# Patient Record
Sex: Female | Born: 1950 | Race: White | Hispanic: No | Marital: Single | Smoking: Current every day smoker
Health system: Southern US, Community
[De-identification: ages and names within clinical notes are randomized; demographics above are authoritative.]

## PROBLEM LIST (undated history)

## (undated) DIAGNOSIS — J449 Chronic obstructive pulmonary disease, unspecified: Secondary | ICD-10-CM

## (undated) HISTORY — PX: FOOT SURGERY: SHX648

## (undated) HISTORY — DX: Chronic obstructive pulmonary disease, unspecified: J44.9

---

## 2011-07-29 ENCOUNTER — Emergency Department: Payer: Self-pay | Admitting: Emergency Medicine

## 2013-06-26 ENCOUNTER — Emergency Department: Payer: Self-pay | Admitting: Emergency Medicine

## 2013-06-26 LAB — URINALYSIS, COMPLETE
BACTERIA: NONE SEEN
BILIRUBIN, UR: NEGATIVE
Glucose,UR: NEGATIVE mg/dL (ref 0–75)
LEUKOCYTE ESTERASE: NEGATIVE
Nitrite: NEGATIVE
Ph: 5 (ref 4.5–8.0)
RBC,UR: 18 /HPF (ref 0–5)
Specific Gravity: 1.023 (ref 1.003–1.030)

## 2014-01-14 ENCOUNTER — Emergency Department: Payer: Self-pay | Admitting: Emergency Medicine

## 2014-04-01 ENCOUNTER — Ambulatory Visit: Payer: Self-pay | Admitting: Internal Medicine

## 2014-06-24 ENCOUNTER — Institutional Professional Consult (permissible substitution): Payer: Self-pay | Admitting: Specialist

## 2014-07-01 ENCOUNTER — Institutional Professional Consult (permissible substitution): Payer: Self-pay | Admitting: Specialist

## 2014-07-03 ENCOUNTER — Other Ambulatory Visit: Payer: Self-pay | Admitting: Specialist

## 2014-07-03 DIAGNOSIS — M79604 Pain in right leg: Secondary | ICD-10-CM

## 2014-07-08 ENCOUNTER — Ambulatory Visit
Admission: RE | Admit: 2014-07-08 | Discharge: 2014-07-08 | Disposition: A | Discharge status: Home / Self Care | Payer: PRIVATE HEALTH INSURANCE | Source: Ambulatory Visit | Attending: Specialist | Admitting: Specialist

## 2014-07-08 DIAGNOSIS — R937 Abnormal findings on diagnostic imaging of other parts of musculoskeletal system: Secondary | ICD-10-CM | POA: Diagnosis not present

## 2014-07-08 DIAGNOSIS — M79604 Pain in right leg: Secondary | ICD-10-CM | POA: Insufficient documentation

## 2014-07-22 ENCOUNTER — Ambulatory Visit: Payer: Self-pay | Admitting: Specialist

## 2014-07-29 ENCOUNTER — Other Ambulatory Visit: Payer: Self-pay | Admitting: Specialist

## 2014-07-29 ENCOUNTER — Ambulatory Visit: Payer: Self-pay | Admitting: Specialist

## 2014-07-29 DIAGNOSIS — M5431 Sciatica, right side: Secondary | ICD-10-CM

## 2014-08-04 ENCOUNTER — Ambulatory Visit
Admission: RE | Admit: 2014-08-04 | Discharge: 2014-08-04 | Disposition: A | Discharge status: Home / Self Care | Payer: PRIVATE HEALTH INSURANCE | Source: Ambulatory Visit | Attending: Specialist | Admitting: Specialist

## 2014-08-04 DIAGNOSIS — M5431 Sciatica, right side: Secondary | ICD-10-CM

## 2014-08-05 ENCOUNTER — Encounter: Payer: Self-pay | Admitting: *Deleted

## 2014-08-05 ENCOUNTER — Ambulatory Visit: Payer: PRIVATE HEALTH INSURANCE | Attending: Oncology | Admitting: *Deleted

## 2014-08-05 ENCOUNTER — Ambulatory Visit
Admission: RE | Admit: 2014-08-05 | Discharge: 2014-08-05 | Disposition: A | Discharge status: Home / Self Care | Payer: Self-pay | Source: Ambulatory Visit | Attending: Oncology | Admitting: Oncology

## 2014-08-05 VITALS — BP 124/84 | HR 65 | Temp 98.1°F | Resp 20 | Ht 64.96 in | Wt 259.7 lb

## 2014-08-05 DIAGNOSIS — Z0000 Encounter for general adult medical examination without abnormal findings: Secondary | ICD-10-CM

## 2014-08-05 NOTE — Progress Notes (Signed)
Subjective:     Patient ID: Jaime Ramsey, female   DOB: 1950-12-28, 64 y.o.   MRN: [id number]  HPI   Review of Systems     Objective:   Physical Exam  Pulmonary/Chest: Right breast exhibits no inverted nipple, no mass, no nipple discharge, no skin change and no tenderness. Left breast exhibits no inverted nipple, no mass, no nipple discharge, no skin change and no tenderness. Breasts are symmetrical.  Abdominal: There is no splenomegaly.  Genitourinary: Rectal exam shows external hemorrhoid. Rectal exam shows no mass. There is no rash, tenderness or lesion on the right labia. There is no rash, tenderness, lesion or injury on the left labia. Cervix exhibits no motion tenderness, no discharge and no friability. Right adnexum displays no mass, no tenderness and no fullness. Left adnexum displays no mass, no tenderness and no fullness. No tenderness or bleeding in the vagina. No foreign body around the vagina. No vaginal discharge found.       Assessment:     64 year old White female presents to [facility] for clinical breast exam, pap smear and mammogram.  Clinical breast exam unremarkable.  Specimen collected for pap smear with some difficulty due to a large rectocele. Taught self breast awareness. Patient has been screened for eligibility.  She does not have any insurance, Medicare or Medicaid.  She also meets financial eligibility.  Hand-out given on the Affordable Care Act.    Plan:     Screening mammogram ordered.  Follow up per [facility] protocol.

## 2014-08-07 ENCOUNTER — Ambulatory Visit
Admission: RE | Admit: 2014-08-07 | Discharge: 2014-08-07 | Disposition: A | Discharge status: Home / Self Care | Payer: PRIVATE HEALTH INSURANCE | Source: Ambulatory Visit | Attending: Specialist | Admitting: Specialist

## 2014-08-07 DIAGNOSIS — M5441 Lumbago with sciatica, right side: Secondary | ICD-10-CM | POA: Insufficient documentation

## 2014-08-10 LAB — PAP LB AND HPV HIGH-RISK
HPV, HIGH-RISK: NEGATIVE
PAP Smear Comment: 0

## 2014-08-11 ENCOUNTER — Telehealth: Payer: Self-pay | Admitting: *Deleted

## 2014-08-11 NOTE — Telephone Encounter (Signed)
Called and informed patient of her normal mammogram results and her pap smear results of HPV negative ASCUS.  Per [facility] protocol patient will be due for next mammo in one year and next pap in 3 years.  Patient voices understanding.

## 2014-08-12 ENCOUNTER — Ambulatory Visit: Payer: Self-pay | Admitting: Ophthalmology

## 2014-08-19 ENCOUNTER — Ambulatory Visit: Payer: Self-pay | Admitting: Specialist

## 2014-09-16 ENCOUNTER — Ambulatory Visit: Payer: Self-pay | Admitting: Internal Medicine

## 2014-10-14 ENCOUNTER — Ambulatory Visit: Payer: Self-pay | Admitting: Internal Medicine

## 2014-11-18 ENCOUNTER — Other Ambulatory Visit: Payer: Self-pay

## 2014-11-18 LAB — TSH: TSH: 1.13 u[IU]/mL (ref <=5.90)

## 2014-11-25 ENCOUNTER — Ambulatory Visit: Payer: Self-pay | Admitting: Internal Medicine

## 2015-03-24 ENCOUNTER — Other Ambulatory Visit: Payer: Self-pay

## 2015-03-31 ENCOUNTER — Ambulatory Visit: Payer: Self-pay | Admitting: Internal Medicine

## 2015-03-31 ENCOUNTER — Other Ambulatory Visit: Payer: Self-pay

## 2015-03-31 LAB — CBC AND DIFFERENTIAL
HEMATOCRIT: 45 % (ref 36–46)
Hemoglobin: 15.5 g/dL (ref 12.0–16.0)
NEUTROS ABS: 4 /uL
PLATELETS: 291 10*3/uL (ref 150–399)
WBC: 6.9 10*3/mL

## 2015-03-31 LAB — BASIC METABOLIC PANEL
BUN: 11 mg/dL (ref 4–21)
Creatinine: 0.9 mg/dL (ref 0.5–1.1)
GLUCOSE: 96 mg/dL
Potassium: 4.5 mmol/L (ref 3.4–5.3)
SODIUM: 142 mmol/L (ref 137–147)

## 2015-03-31 LAB — HEPATIC FUNCTION PANEL
ALT: 15 U/L (ref 7–35)
AST: 12 U/L — AB (ref 13–35)
Alkaline Phosphatase: 79 U/L (ref 25–125)
BILIRUBIN, TOTAL: 0.3 mg/dL

## 2015-03-31 LAB — MICROALBUMIN, URINE: Microalb, Ur: 4.1

## 2015-03-31 LAB — LIPID PANEL
Cholesterol: 204 mg/dL — AB (ref 0–200)
HDL: 47 mg/dL (ref 35–70)
LDL Cholesterol: 125 mg/dL
Triglycerides: 162 mg/dL — AB (ref 40–160)

## 2015-03-31 LAB — HEMOGLOBIN A1C: Hemoglobin A1C: 5.8

## 2015-04-07 ENCOUNTER — Ambulatory Visit: Payer: Self-pay | Admitting: Internal Medicine

## 2015-04-07 DIAGNOSIS — L309 Dermatitis, unspecified: Secondary | ICD-10-CM | POA: Insufficient documentation

## 2015-04-07 DIAGNOSIS — R030 Elevated blood-pressure reading, without diagnosis of hypertension: Secondary | ICD-10-CM | POA: Insufficient documentation

## 2015-04-07 DIAGNOSIS — I10 Essential (primary) hypertension: Secondary | ICD-10-CM | POA: Insufficient documentation

## 2015-05-14 DIAGNOSIS — R030 Elevated blood-pressure reading, without diagnosis of hypertension: Secondary | ICD-10-CM

## 2015-05-14 DIAGNOSIS — L309 Dermatitis, unspecified: Secondary | ICD-10-CM

## 2015-05-14 DIAGNOSIS — I10 Essential (primary) hypertension: Secondary | ICD-10-CM

## 2015-07-07 ENCOUNTER — Ambulatory Visit: Payer: Self-pay | Admitting: Internal Medicine

## 2015-07-08 LAB — COMPREHENSIVE METABOLIC PANEL
ALBUMIN: 4.2 g/dL (ref 3.6–4.8)
ALK PHOS: 74 IU/L (ref 39–117)
ALT: 13 IU/L (ref 0–32)
AST: 12 IU/L (ref 0–40)
Albumin/Globulin Ratio: 1.6 (ref 1.2–2.2)
BILIRUBIN TOTAL: 0.4 mg/dL (ref 0.0–1.2)
BUN / CREAT RATIO: 11 — AB (ref 12–28)
BUN: 11 mg/dL (ref 8–27)
CHLORIDE: 96 mmol/L (ref 96–106)
CO2: 27 mmol/L (ref 18–29)
Calcium: 9.7 mg/dL (ref 8.7–10.3)
Creatinine, Ser: 0.99 mg/dL (ref 0.57–1.00)
GFR calc Af Amer: 70 mL/min/{1.73_m2} (ref >59)
GFR calc non Af Amer: 60 mL/min/{1.73_m2} (ref >59)
GLOBULIN, TOTAL: 2.7 g/dL (ref 1.5–4.5)
Glucose: 102 mg/dL — ABNORMAL HIGH (ref 65–99)
Potassium: 4.4 mmol/L (ref 3.5–5.2)
SODIUM: 140 mmol/L (ref 134–144)
Total Protein: 6.9 g/dL (ref 6.0–8.5)

## 2015-07-08 LAB — CBC WITH DIFFERENTIAL/PLATELET
BASOS ABS: 0 10*3/uL (ref 0.0–0.2)
Basos: 0 %
EOS (ABSOLUTE): 0.3 10*3/uL (ref 0.0–0.4)
EOS: 4 %
HEMATOCRIT: 46.2 % (ref 34.0–46.6)
HEMOGLOBIN: 15.9 g/dL (ref 11.1–15.9)
IMMATURE GRANULOCYTES: 0 %
Immature Grans (Abs): 0 10*3/uL (ref 0.0–0.1)
Lymphocytes Absolute: 1.6 10*3/uL (ref 0.7–3.1)
Lymphs: 22 %
MCH: 28.2 pg (ref 26.6–33.0)
MCHC: 34.4 g/dL (ref 31.5–35.7)
MCV: 82 fL (ref 79–97)
Monocytes Absolute: 0.5 10*3/uL (ref 0.1–0.9)
Monocytes: 7 %
NEUTROS PCT: 67 %
Neutrophils Absolute: 4.7 10*3/uL (ref 1.4–7.0)
Platelets: 278 10*3/uL (ref 150–379)
RBC: 5.64 x10E6/uL — ABNORMAL HIGH (ref 3.77–5.28)
RDW: 15 % (ref 12.3–15.4)
WBC: 7.2 10*3/uL (ref 3.4–10.8)

## 2015-07-14 ENCOUNTER — Encounter: Payer: Self-pay | Admitting: Internal Medicine

## 2015-07-14 ENCOUNTER — Ambulatory Visit: Payer: Self-pay | Admitting: Internal Medicine

## 2015-07-14 VITALS — BP 137/82 | HR 67 | Temp 98.1°F | Wt 248.0 lb

## 2015-07-14 DIAGNOSIS — R030 Elevated blood-pressure reading, without diagnosis of hypertension: Secondary | ICD-10-CM

## 2015-07-14 NOTE — Progress Notes (Signed)
   Subjective:    Patient ID: Jaime Ramsey, female    DOB: 06/03/1950, 65 y.o.   MRN: [id number]  HPI  Patient Active Problem List   Diagnosis Date Noted  . Eczema 04/07/2015  . Borderline hypertension 04/07/2015   Pt will be 65 in September and will go on Medicare.   Review of Systems     Objective:   Physical Exam  Constitutional: She is oriented to person, place, and time.  Cardiovascular: Normal rate, regular rhythm and normal heart sounds.   Pulmonary/Chest: Effort normal and breath sounds normal.  Neurological: She is alert and oriented to person, place, and time.    Outpatient Encounter Prescriptions as of 07/14/2015  Medication Sig  . hydrochlorothiazide (HYDRODIURIL) 25 MG tablet Take 12.5 mg by mouth 3 (three) times a week. HCTZ 25mg  - Take 1/2 tablet (12.5mg ) PO QD.  Marland Kitchen. ibuprofen (ADVIL,MOTRIN) 200 MG tablet Take 200 mg by mouth every 6 (six) hours as needed.   No facility-administered encounter medications on file as of 07/14/2015.   BP 137/82 mmHg  Pulse 67  Temp(Src) 98.1 F (36.7 C)  Wt 248 lb (112.492 kg)      Assessment & Plan:  BP was good today. Labs look good.   When patient turns 89 and gets Medicare, [facility] to get set up with a primary care doctor. Advised to call soon and get appointment established.

## 2015-08-18 ENCOUNTER — Ambulatory Visit: Payer: Self-pay | Admitting: Ophthalmology

## 2017-02-01 IMAGING — CR DG LUMBAR SPINE COMPLETE 4+V
1 series · 5 of 5 positions shown · non-contrast
Comparison: None.

CLINICAL DATA: Right leg pain for 2 years

EXAM:
LUMBAR SPINE - COMPLETE 4+ VIEW

[Series 1: dg lumbar spine complete 4 +v · 0.14mm/px · 5 of 5 slices shown]
[im 1/5]
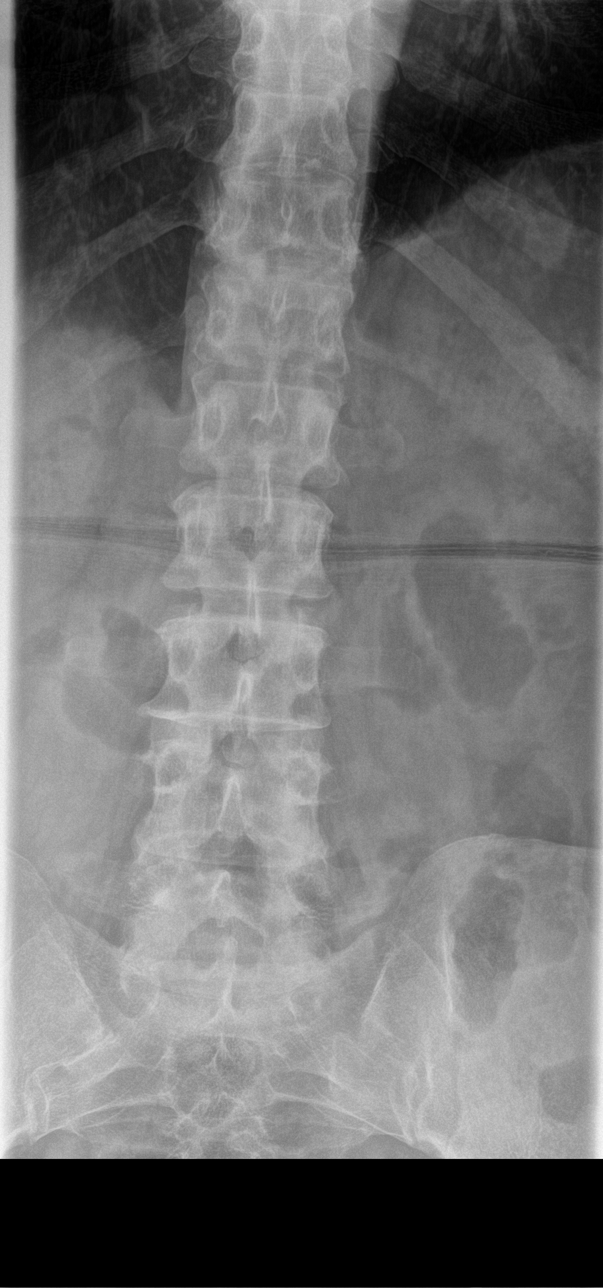
[im 2/5]
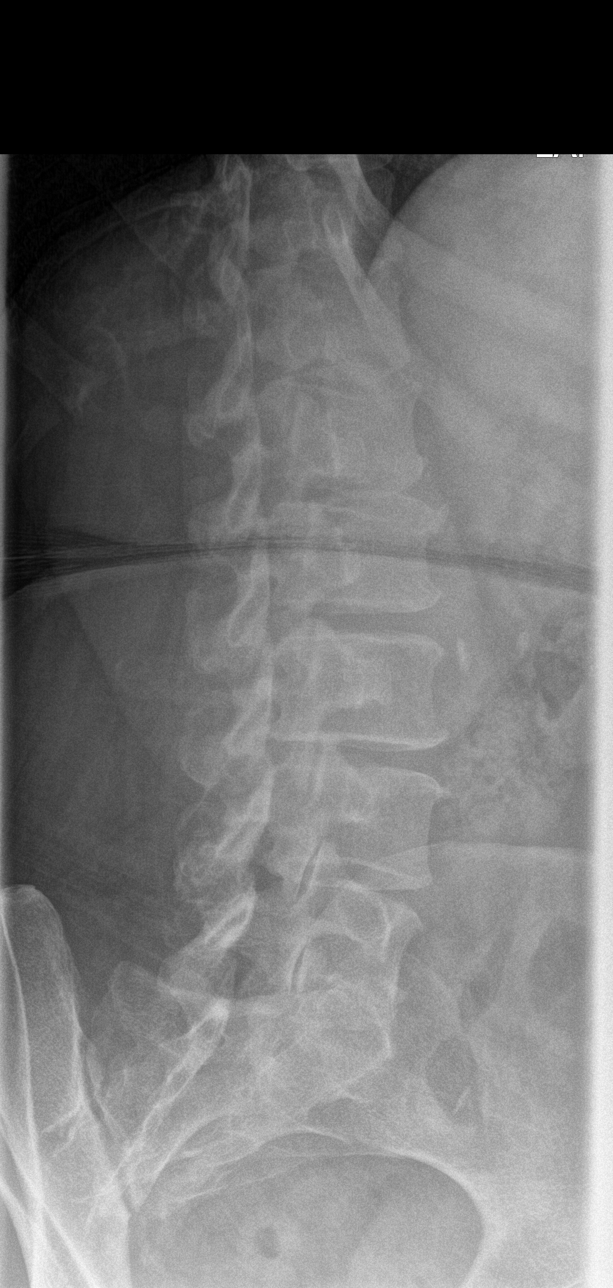
[im 3/5]
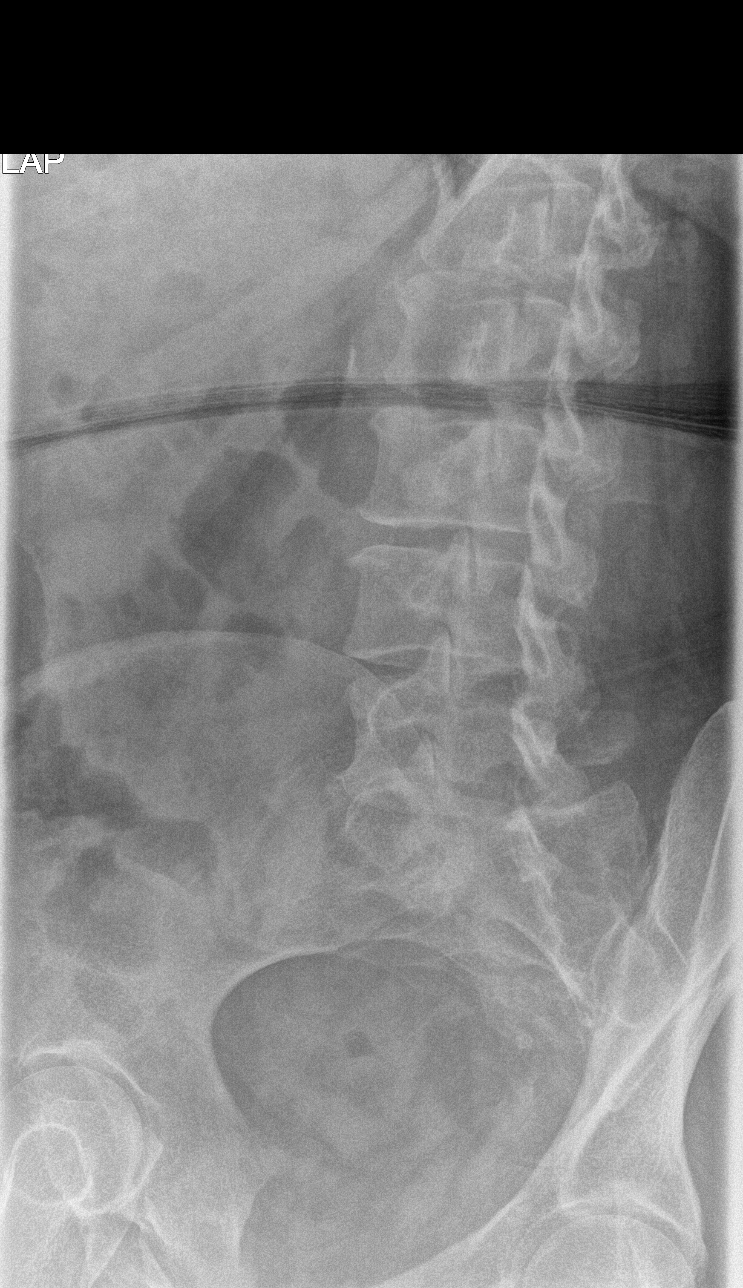
[im 4/5]
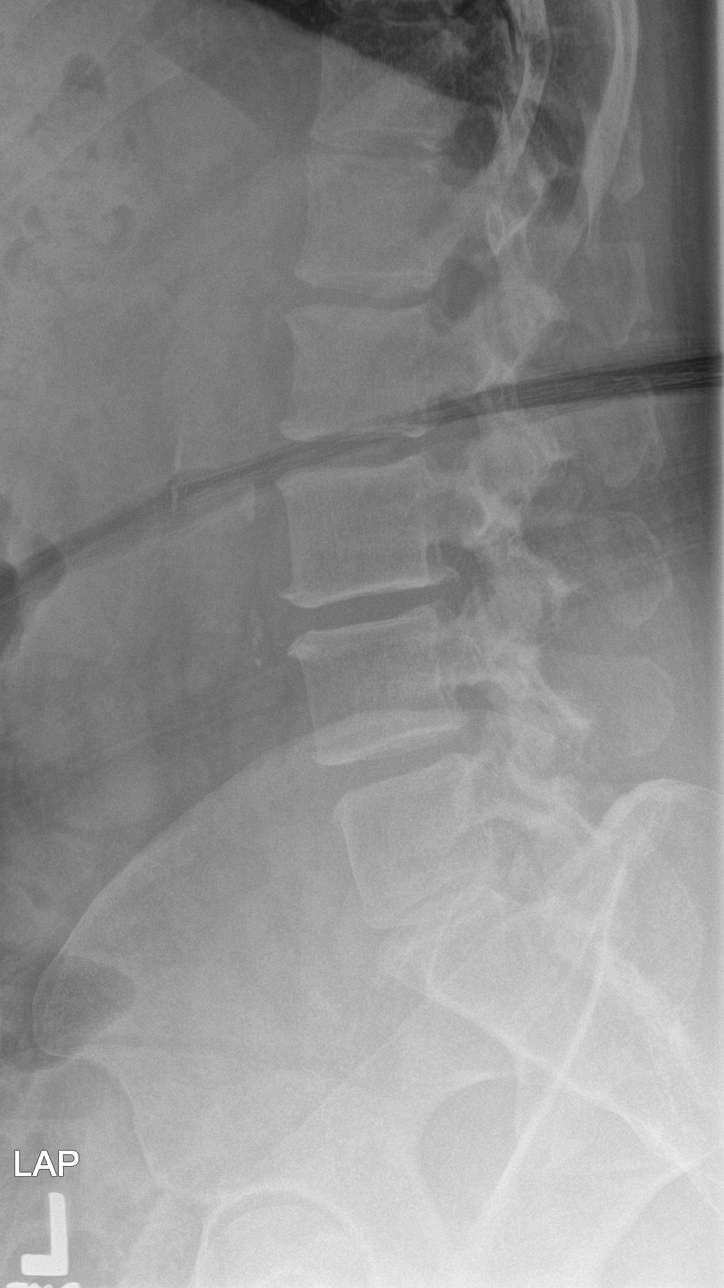
[im 5/5]
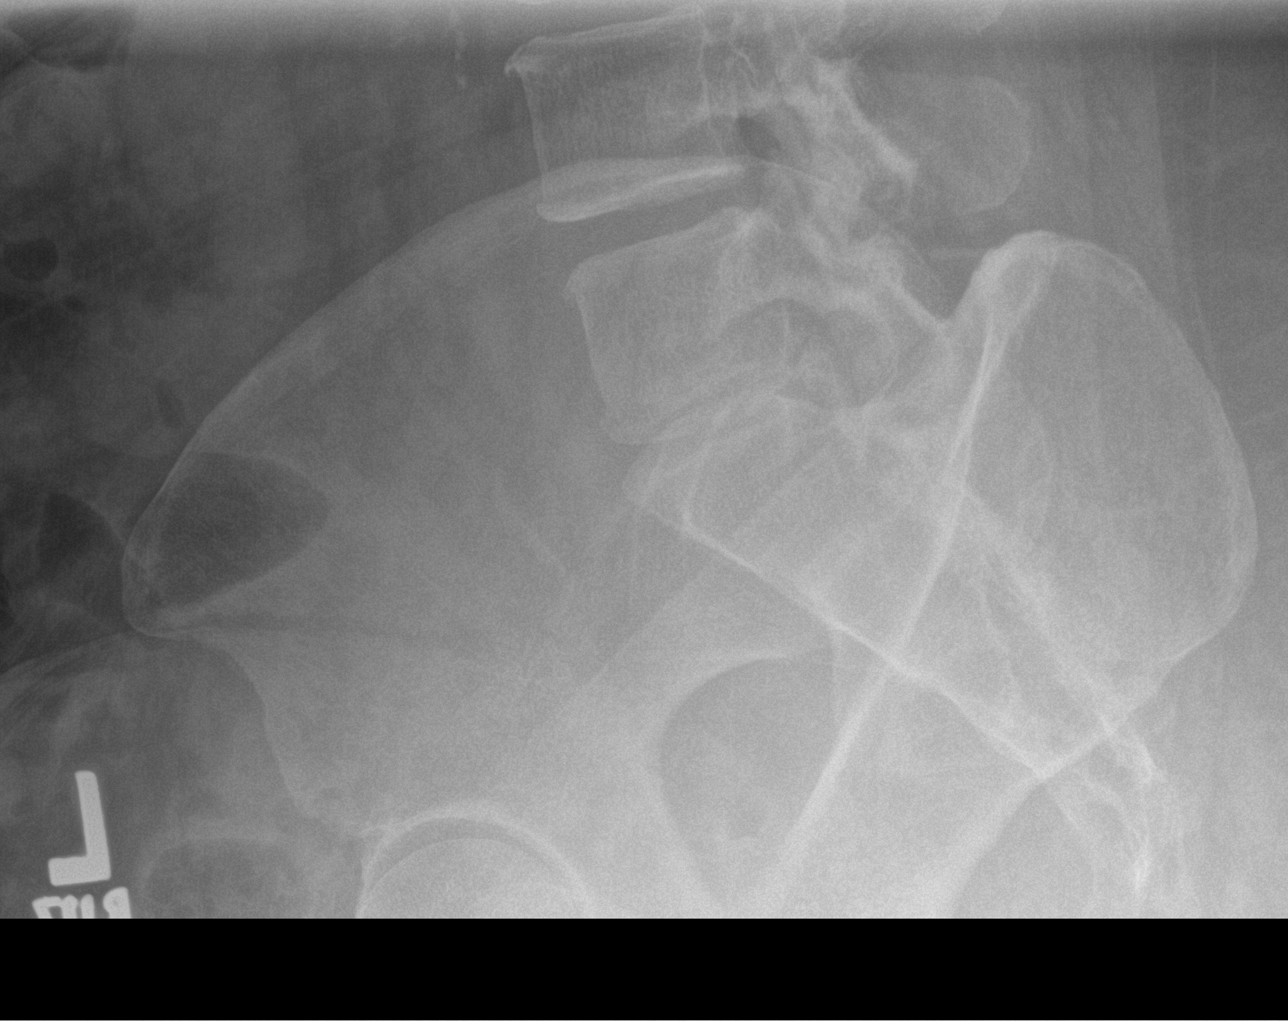

[5 of 5 positions shown; findings below may reference images not displayed]

FINDINGS: The lumbar vertebrae are in normal alignment. There may be minimal
degenerative disc disease at L3-4 with there is slight loss of disc
space and sclerosis with spurring. No compression deformity is seen.
On oblique views the facet joints are unremarkable. The SI joints
are corticated.
IMPRESSION: Normal alignment.  Possible mild degenerative disc disease at L3-4.
